# Patient Record
Sex: Female | Born: 1985 | Race: White | Hispanic: No | Marital: Married | Smoking: Never smoker
Health system: Southern US, Community
[De-identification: ages and names within clinical notes are randomized; demographics above are authoritative.]

## PROBLEM LIST (undated history)

## (undated) HISTORY — PX: WISDOM TOOTH EXTRACTION: SHX21

---

## 2007-04-11 ENCOUNTER — Other Ambulatory Visit: Payer: Self-pay

## 2007-04-11 ENCOUNTER — Emergency Department: Payer: Self-pay | Admitting: Emergency Medicine

## 2007-05-27 ENCOUNTER — Observation Stay: Payer: Self-pay

## 2007-07-25 ENCOUNTER — Inpatient Hospital Stay: Payer: Self-pay | Admitting: Obstetrics & Gynecology

## 2010-06-30 ENCOUNTER — Ambulatory Visit: Payer: Self-pay | Admitting: General Practice

## 2011-11-19 IMAGING — CR DG ELBOW COMPLETE 3+V*L*
1 series · 5 of 5 positions shown · non-contrast
Comparison: none

REASON FOR EXAM: pain, injury
COMMENTS:

[Series 1: view not recorded · 0.17mm/px · 5 of 5 slices shown]
[im 1/5]
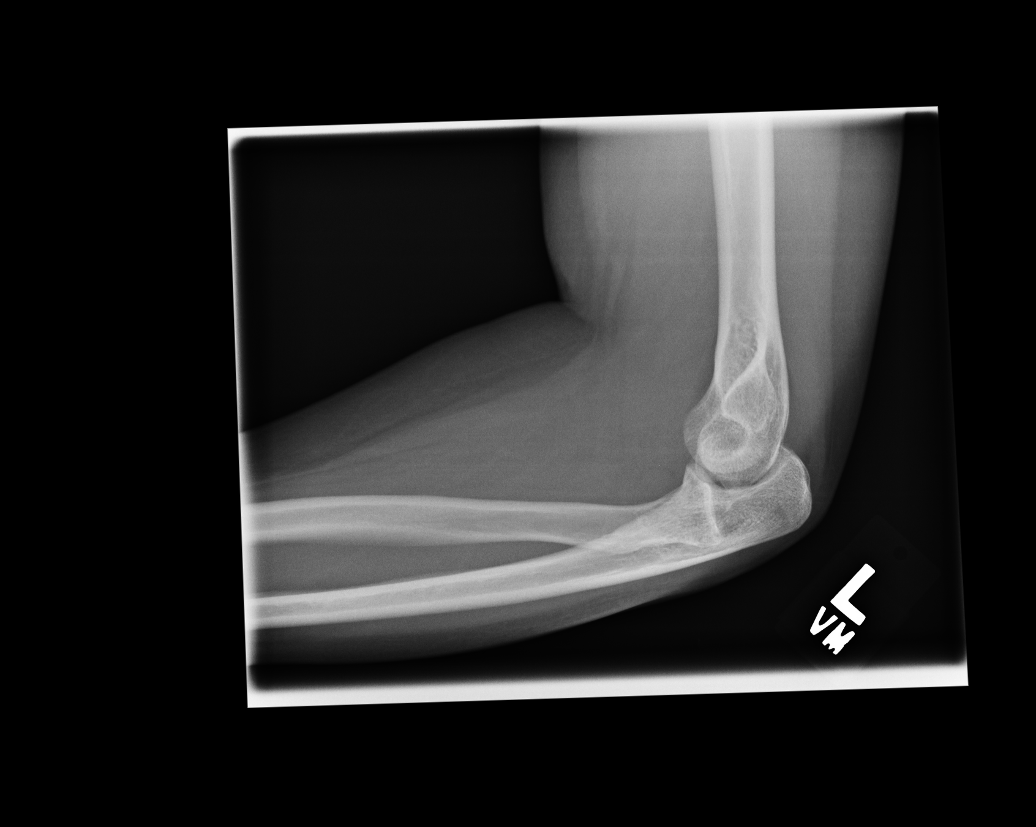
[im 2/5]
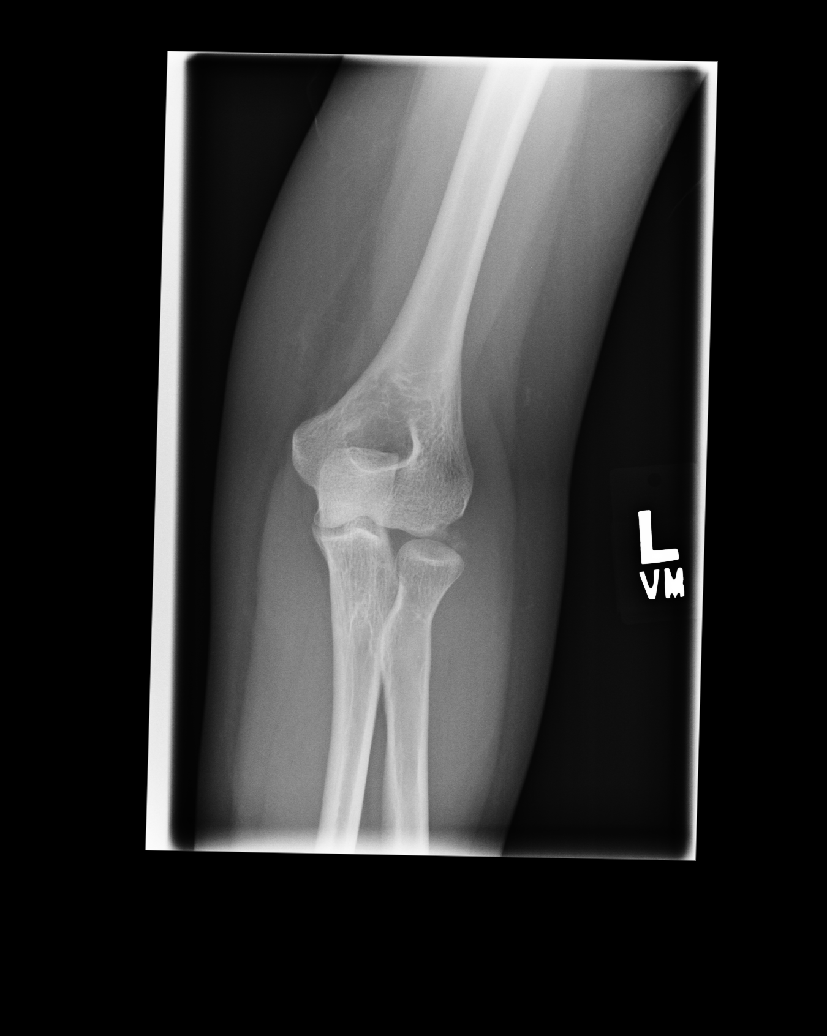
[im 3/5]
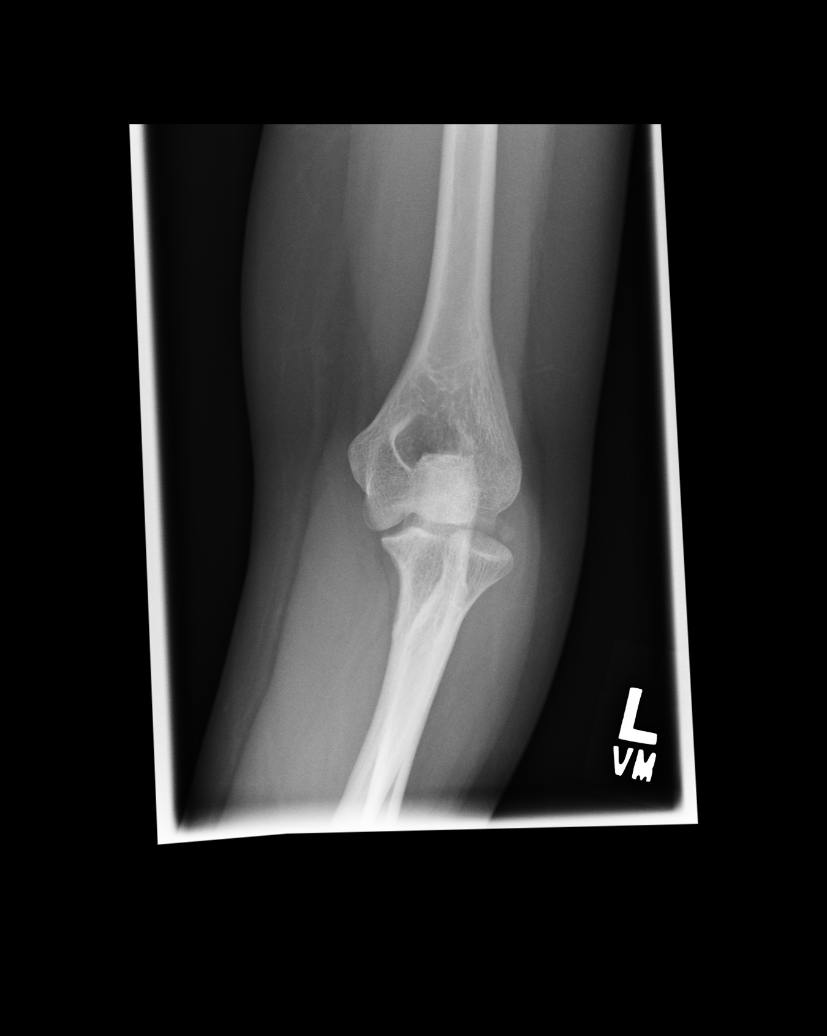
[im 4/5]
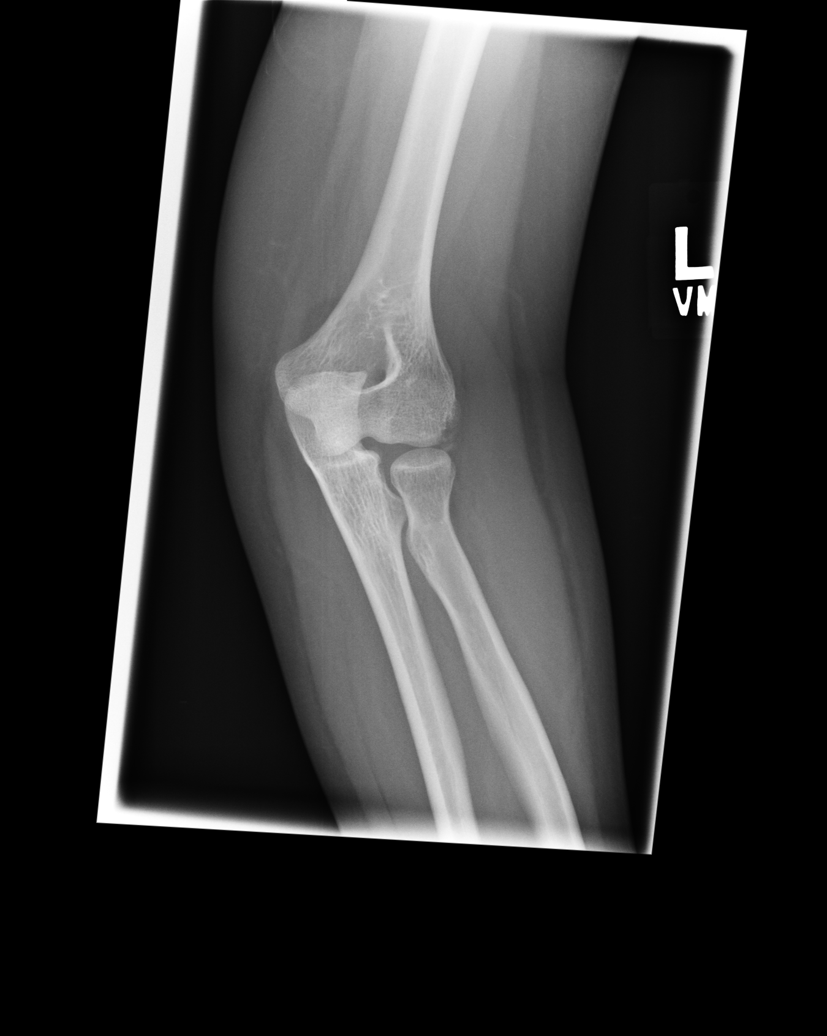
[im 5/5]
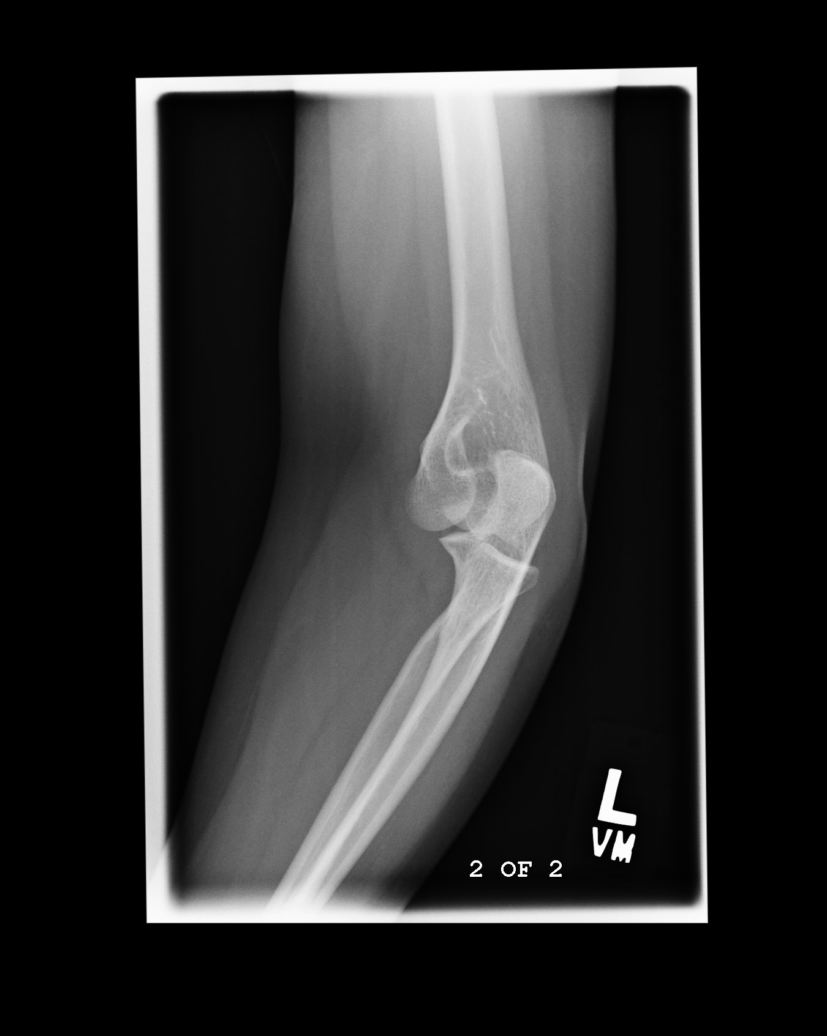

[5 of 5 positions shown; findings below may reference images not displayed]

PROCEDURE:     DXR - DXR ELBOW LT COMP W/OBLIQUES  - June 30, 2010  [DATE]

RESULT:     Images of the left elbow demonstrate that the radial head
appears to be dislocated dorsally on the lateral view. There does not appear
to be evidence of hemarthrosis. There is irregularity about the distal
lateral aspect of the humerus in the lateral capitellar region which appears
chronic. An acute avulsion is felt to be less likely. Has there been a
history of lateral epicondylitis? The radial head appears intact. The ulna
appears intact proximally. Forearm images to evaluate for distal ulnar
fracture may be beneficial.
IMPRESSION: Subluxation dorsally of the radial head with some
irregularity about the lateral capitellar region possibly secondary to
chronic inflammation. Osteochondritis desiccans or lateral epicondylitis
could be considered.

## 2011-11-19 IMAGING — CR DG C-ARM 1-60 MIN
2 series · 3 of 3 positions shown · non-contrast
Comparison: none

REASON FOR EXAM: Fracture Reduction/Fixation of Left Elbow
COMMENTS:

PROCEDURE:     DXR - DXR C-ARM WITH SPOT IMAGES  - June 30, 2010  [DATE]
RESULT:     Digital lateral C-arm images show the radial head is in an
anatomic location compared to the previous exam. A posterior splint appears
present.

[cont.]
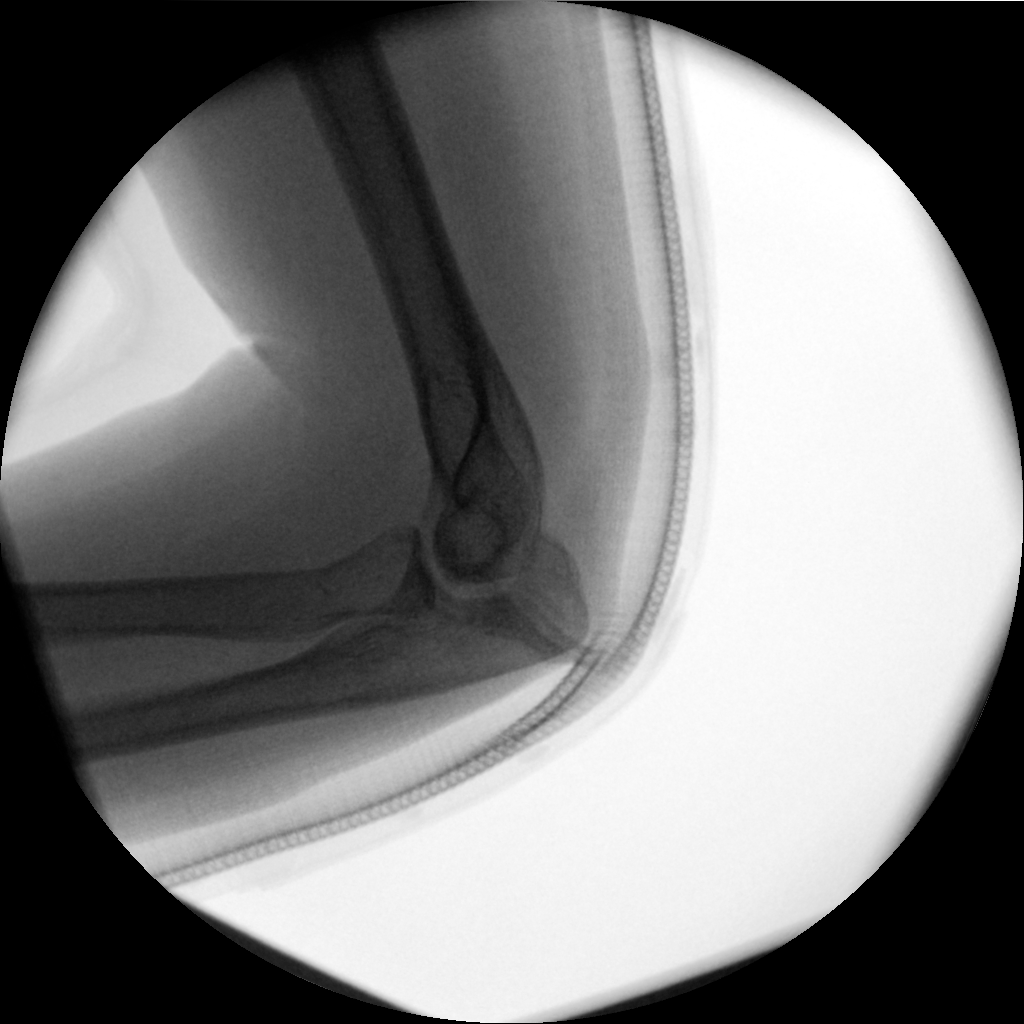

[Series 6002: (id) · 2 of 2 slices shown]
[im 1/2]
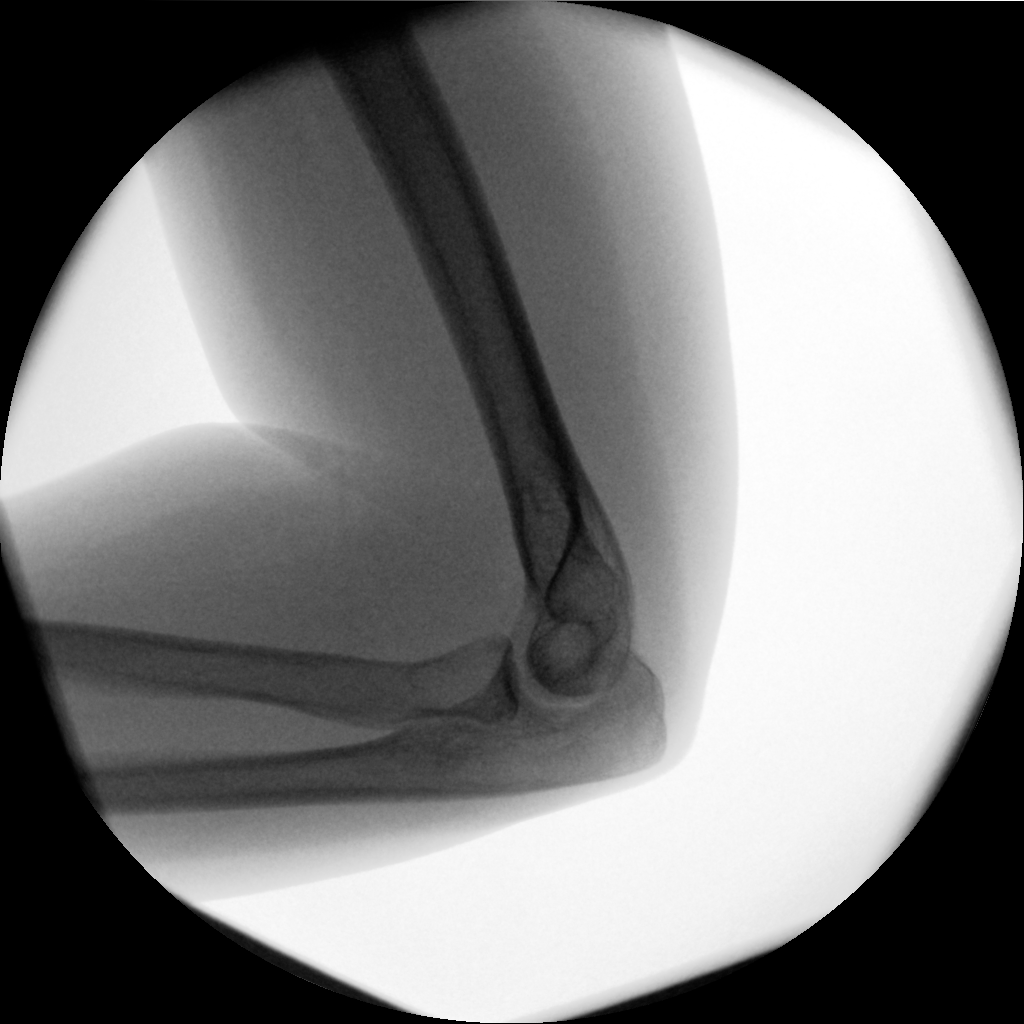
[im 2/2]
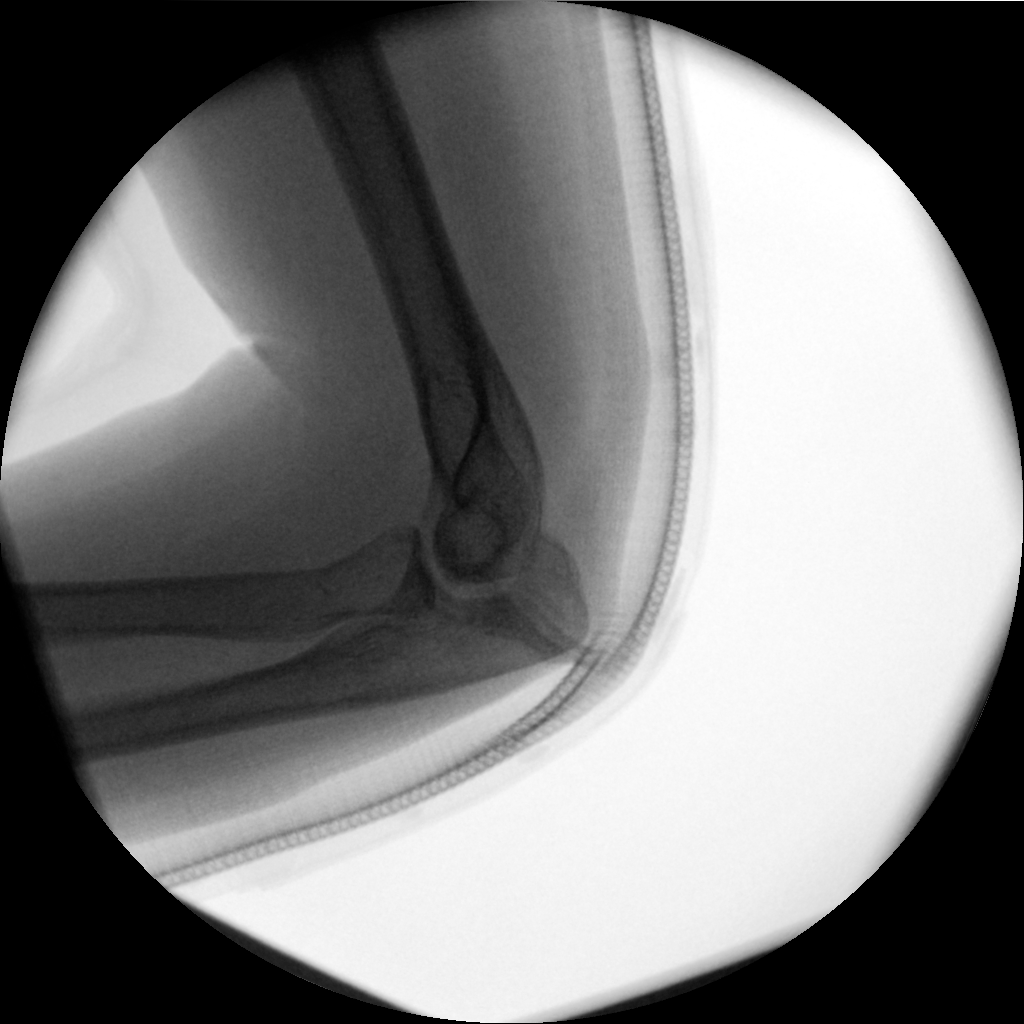

[3 of 3 positions shown; findings below may reference images not displayed]

IMPRESSION: Reduction of the radial head dislocation.

## 2012-09-20 HISTORY — PX: INTRAUTERINE DEVICE (IUD) INSERTION: SHX5877

## 2017-09-16 ENCOUNTER — Telehealth: Payer: Self-pay | Admitting: Advanced Practice Midwife

## 2017-09-16 ENCOUNTER — Encounter: Payer: Self-pay | Admitting: Maternal Newborn

## 2017-09-16 ENCOUNTER — Ambulatory Visit (INDEPENDENT_AMBULATORY_CARE_PROVIDER_SITE_OTHER): Payer: BLUE CROSS/BLUE SHIELD | Admitting: Maternal Newborn

## 2017-09-16 VITALS — BP 120/90 | HR 66 | Ht 61.0 in | Wt 137.0 lb

## 2017-09-16 DIAGNOSIS — Z124 Encounter for screening for malignant neoplasm of cervix: Secondary | ICD-10-CM

## 2017-09-16 DIAGNOSIS — Z01419 Encounter for gynecological examination (general) (routine) without abnormal findings: Secondary | ICD-10-CM | POA: Diagnosis not present

## 2017-09-16 NOTE — Telephone Encounter (Signed)
Patient scheduled 1/21 with Erskine SquibbJane for Mirena replacement

## 2017-09-16 NOTE — Progress Notes (Signed)
Gynecology Annual Exam  PCP: Patient, No Pcp Per  Chief Complaint:  Chief Complaint  Patient presents with  . Gynecologic Exam    iud needs replacing    History of Present Illness: Patient is a 32 y.o. G1P1001 presents for annual exam. The patient has no complaints today.   LMP: No LMP recorded (lmp unknown). Patient is not currently having periods (Reason: IUD). Intermenstrual Bleeding: no Postcoital Bleeding: no Dysmenorrhea: not applicable  The patient is sexually active. She currently uses IUD for contraception. She denies dyspareunia.  The patient does not perform self breast exams.  There is no notable family history of breast or ovarian cancer in her family.  The patient wears seatbelts: yes.   The patient has regular exercise: yes.    The patient denies current symptoms of depression.    Review of Systems  Constitutional: Negative.   HENT: Negative.   Eyes: Negative.   Respiratory: Negative for cough, shortness of breath and wheezing.   Cardiovascular: Negative for chest pain and palpitations.  Gastrointestinal: Negative for abdominal pain, constipation, diarrhea, heartburn and nausea.  Genitourinary: Negative.   Musculoskeletal: Negative.   Skin: Negative.   Neurological: Negative.   Endo/Heme/Allergies: Negative.   Psychiatric/Behavioral: Negative for depression. The patient is not nervous/anxious.   All other systems reviewed and are negative.   Past Medical History:  History reviewed. No pertinent past medical history.  Past Surgical History:  Past Surgical History:  Procedure Laterality Date  . CESAREAN SECTION  2008  . INTRAUTERINE DEVICE (IUD) INSERTION  09/20/2012  . WISDOM TOOTH EXTRACTION  05/2017; 08/2015   two taken    Gynecologic History:  No LMP recorded (lmp unknown). Patient is not currently having periods (Reason: IUD). Contraception: IUD Last Pap: No records of previous Pap.  Obstetric History: G1P1001  Family History:    History reviewed. No pertinent family history.  Social History:  Social History   Socioeconomic History  . Marital status: Married    Spouse name: Not on file  . Number of children: Not on file  . Years of education: Not on file  . Highest education level: Not on file  Social Needs  . Financial resource strain: Not on file  . Food insecurity - worry: Not on file  . Food insecurity - inability: Not on file  . Transportation needs - medical: Not on file  . Transportation needs - non-medical: Not on file  Occupational History  . Not on file  Tobacco Use  . Smoking status: Never Smoker  . Smokeless tobacco: Never Used  Substance and Sexual Activity  . Alcohol use: Yes    Comment: occ  . Drug use: No  . Sexual activity: Yes    Birth control/protection: IUD  Other Topics Concern  . Not on file  Social History Narrative  . Not on file    Allergies:  No Known Allergies  Medications: Prior to Admission medications   Medication Sig Start Date End Date Taking? Authorizing Provider  levonorgestrel (MIRENA) 20 MCG/24HR IUD 1 each by Intrauterine route once.   Yes [provider]    Physical Exam Vitals: Blood pressure 120/90, pulse 66, height 5\' 1"  (1.549 m), weight 137 lb (62.1 kg).  General: NAD HEENT: normocephalic, anicteric Thyroid: no enlargement, no palpable nodules Pulmonary: No increased work of breathing, CTAB Cardiovascular: RRR, S1, S2 heard, no murmurs, rubs or gallops Breast: Breasts symmetrical, no tenderness, no palpable nodules or masses, no skin or nipple retraction present, no  nipple discharge.  No axillary or supraclavicular lymphadenopathy. Abdomen: soft, non-tender, non-distended.  Umbilicus without lesions.  No hepatomegaly, splenomegaly or masses palpable. No evidence of hernia  Genitourinary:  External: Normal external female genitalia.  Normal urethral  meatus, normal Bartholin's and Skene's glands.    Vagina: Normal vaginal mucosa, no  evidence of prolapse.    Cervix: Grossly normal in appearance, no bleeding, IUD strings  visible  Uterus: Non-enlarged, mobile, normal contour.  No CMT  Adnexa: ovaries non-enlarged, no adnexal masses  Rectal: deferred  Lymphatic: no evidence of inguinal lymphadenopathy Extremities: no edema, erythema, or tenderness Neurologic: Grossly intact Psychiatric: mood appropriate, affect full  Assessment: 32 y.o. G1P1001 routine annual exam.  Plan: Problem List Items Addressed This Visit    None    Visit Diagnoses    Encounter for annual routine gynecological examination    -  Primary   Pap smear for cervical cancer screening       Relevant Orders   Pap IG and HPV (high risk) DNA detection      1) STI screening was offered and declined.  2) ASCCP guidelines and rationale discussed.  Patient opts for every 3 year screening interval.  3) Contraception - She is satisfied with her Mirena IUD which will be 34 years old in a few days. She will schedule a removal and reinsertion appointment today.  4) Routine healthcare maintenance including cholesterol, diabetes screening discussed: Declines.  5) Follow up 1 year for routine annual exam.  Marcelyn Bruins, CNM 09/16/2017  10:12 AM

## 2017-09-19 ENCOUNTER — Ambulatory Visit: Payer: BLUE CROSS/BLUE SHIELD | Admitting: Advanced Practice Midwife

## 2017-09-19 ENCOUNTER — Encounter: Payer: Self-pay | Admitting: Advanced Practice Midwife

## 2017-09-19 VITALS — BP 120/70 | HR 72 | Ht 61.0 in | Wt 142.0 lb

## 2017-09-19 DIAGNOSIS — Z30433 Encounter for removal and reinsertion of intrauterine contraceptive device: Secondary | ICD-10-CM

## 2017-09-19 LAB — PAP IG AND HPV HIGH-RISK
HPV, HIGH-RISK: NEGATIVE
PAP Smear Comment: 0

## 2017-09-19 NOTE — Progress Notes (Signed)
    GYNECOLOGY OFFICE PROCEDURE NOTE  Sharon RangeMelissa Diaz is a 32 y.o. G1P1001 here for IUD removal and reinsertion. The patient currently has a Mirena IUD placed 5 years ago, which will be replaced with a Mirena IUD today.  No GYN concerns.  Last pap smear was on 09/16/2017 and cytology results are pending, HPV is negative.  IUD Removal and Reinsertion  Patient identified, informed consent performed, consent signed.   Discussed risks of irregular bleeding, cramping, infection, malpositioning or uterine perforation of the IUD which may require further procedures. Time out was performed. Speculum placed in the vagina. The strings of the IUD were grasped and pulled using curved forceps. The IUD was successfully removed in its entirety. The cervix was cleaned with Betadine x 2 and grasped posteriorly with a single tooth tenaculum. IUD insertion apparatus was used to sound the uterus to 6 cm using a uterine sound.  The IUD was then placed per manufacturer's recommendations. Strings trimmed to 2 cm. Tenaculum was removed, good hemostasis noted. Patient tolerated procedure well.   Patient was given post-procedure instructions.  Patient was also asked to check IUD strings periodically and follow up in 6 weeks for IUD check.   Tresea MallJane Omarius Grantham, CNM  [facility], [facility]  IUD insertion CPT [id number],  Christean GriefSkyla [id number] Mirena [phone] Penni BombardLiletta [phone] Paraguard [id number] IUD remval [phone] Modifer 25, plus Modifer 79 is done during a global billing visit

## 2017-09-19 NOTE — Patient Instructions (Signed)

## 2017-10-17 ENCOUNTER — Encounter: Payer: Self-pay | Admitting: Advanced Practice Midwife

## 2017-10-17 ENCOUNTER — Ambulatory Visit: Payer: BLUE CROSS/BLUE SHIELD | Admitting: Advanced Practice Midwife

## 2017-10-17 VITALS — BP 112/72 | Ht 61.0 in | Wt 138.0 lb

## 2017-10-17 DIAGNOSIS — Z30431 Encounter for routine checking of intrauterine contraceptive device: Secondary | ICD-10-CM | POA: Diagnosis not present

## 2017-10-17 NOTE — Progress Notes (Signed)
Obstetrics & Gynecology Office Visit   Chief Complaint:  Chief Complaint  Patient presents with  . Follow-up    IUD string check    History of Present Illness: 32 y.o. patient presenting for follow up of Mirena IUD placement 4 weeks ago. She denies any complications since her IUD placement.  She is not having any bleeding and she has not tried to feel the strings.   Review of Systems: Review of Systems  Constitutional: Negative.   HENT: Negative.   Eyes: Negative.   Respiratory: Negative.   Cardiovascular: Negative.   Gastrointestinal: Negative.   Genitourinary: Negative.   Musculoskeletal: Negative.   Skin: Negative.   Neurological: Negative.   Endo/Heme/Allergies: Negative.   Psychiatric/Behavioral: Negative.     Past Medical History:  No past medical history on file.  Past Surgical History:  Past Surgical History:  Procedure Laterality Date  . CESAREAN SECTION  2008  . INTRAUTERINE DEVICE (IUD) INSERTION  09/20/2012  . WISDOM TOOTH EXTRACTION  05/2017; 08/2015   two taken    Gynecologic History: No LMP recorded. Patient is not currently having periods (Reason: IUD).  Obstetric History: G1P1001  Family History:  No family history on file.  Social History:  Social History   Socioeconomic History  . Marital status: Married    Spouse name: Not on file  . Number of children: Not on file  . Years of education: Not on file  . Highest education level: Not on file  Social Needs  . Financial resource strain: Not on file  . Food insecurity - worry: Not on file  . Food insecurity - inability: Not on file  . Transportation needs - medical: Not on file  . Transportation needs - non-medical: Not on file  Occupational History  . Not on file  Tobacco Use  . Smoking status: Never Smoker  . Smokeless tobacco: Never Used  Substance and Sexual Activity  . Alcohol use: Yes    Comment: occ  . Drug use: No  . Sexual activity: Yes    Birth control/protection:  IUD  Other Topics Concern  . Not on file  Social History Narrative  . Not on file    Allergies:  No Known Allergies  Medications: Prior to Admission medications   Medication Sig Start Date End Date Taking? Authorizing Provider  levonorgestrel (MIRENA) 20 MCG/24HR IUD 1 each by Intrauterine route once.    [provider]    Physical Exam Vitals:  Vitals:   10/17/17 0837  BP: 112/72   No LMP recorded. Patient is not currently having periods (Reason: IUD).  General: NAD HEENT: normocephalic, anicteric Pulmonary: No increased work of breathing Cardiovascular: RRR, distal pulses 2+ Genitourinary:  External: Normal external female genitalia.  Normal urethral meatus, normal  Bartholin's and Skene's glands.    Vagina: Normal vaginal mucosa, no evidence of prolapse.    Cervix: Grossly normal in appearance, no bleeding, IUD strings not visible initially but teased out of cervix with curved forceps enough to know they are there   Uterus: Non-enlarged, mobile, normal contour.  No CMT  Adnexa: ovaries non-enlarged, no adnexal masses  Rectal: deferred  Lymphatic: no evidence of inguinal lymphadenopathy Extremities: no edema, erythema, or tenderness Neurologic: Grossly intact Psychiatric: mood appropriate, affect full   Assessment: 32 y.o. G1P1001 IUD string check  Plan: Problem List Items Addressed This Visit    None       1.  The patient was given instructions to check her IUD  strings monthly and call with any problems or concerns.  She should call for fevers, chills, abnormal vaginal discharge, pelvic pain, or other complaints. Given that strings are short and essentially inside the cervix she is encouraged to come in for u/s visualization for increase in pain.  2.   IUDs while effective at preventing pregnancy do not prevent transmission of sexually transmitted diseases and use of barrier methods for this purpose was discussed.  Low overall incidence of failure with  99.7% efficacy rate in typical use.  The patient has not contraindication to IUD placement.  3.  She will return for a annual exam in 1 year.  All questions answered.  4) A total of 15 minutes were spent in face-to-face contact with the patient during this encounter with over half of that time devoted to counseling and coordination of care.  5) No Follow-up on file.   Tresea MallJane Rubbie Goostree, CNM [facility], [facility] 10/17/2017, 9:01 AM

## 2017-10-25 NOTE — Telephone Encounter (Signed)
Mirena received 09/19/17

## 2018-03-13 ENCOUNTER — Encounter: Payer: Self-pay | Admitting: Emergency Medicine

## 2018-03-13 ENCOUNTER — Other Ambulatory Visit: Payer: Self-pay

## 2018-03-13 ENCOUNTER — Ambulatory Visit
Admission: EM | Admit: 2018-03-13 | Discharge: 2018-03-13 | Disposition: A | Discharge status: Home / Self Care | Payer: BLUE CROSS/BLUE SHIELD | Attending: Family Medicine | Admitting: Family Medicine

## 2018-03-13 DIAGNOSIS — T63421A Toxic effect of venom of ants, accidental (unintentional), initial encounter: Secondary | ICD-10-CM | POA: Diagnosis not present

## 2018-03-13 DIAGNOSIS — W57XXXA Bitten or stung by nonvenomous insect and other nonvenomous arthropods, initial encounter: Secondary | ICD-10-CM

## 2018-03-13 DIAGNOSIS — S90862A Insect bite (nonvenomous), left foot, initial encounter: Secondary | ICD-10-CM | POA: Diagnosis not present

## 2018-03-13 DIAGNOSIS — S90861A Insect bite (nonvenomous), right foot, initial encounter: Secondary | ICD-10-CM | POA: Diagnosis not present

## 2018-03-13 MED ORDER — SULFAMETHOXAZOLE-TRIMETHOPRIM 800-160 MG PO TABS: 1.0000 | ORAL_TABLET | Freq: Two times a day (BID) | ORAL | 0 refills | Status: DC

## 2018-03-13 NOTE — ED Provider Notes (Signed)
[facility]    CSN: [id number] Arrival date & time: 03/13/18  1232     History   Chief Complaint Chief Complaint  Patient presents with  . Insect Bite    HPI Sharon Diaz is a 32 y.o. female.   32 yo female c/o fire ant bites to feet 3 days ago and now with areas red, tender and warm. Denies fevers or chills.   The history is provided by the patient.    History reviewed. No pertinent past medical history.  There are no active problems to display for this patient.   Past Surgical History:  Procedure Laterality Date  . CESAREAN SECTION  2008  . INTRAUTERINE DEVICE (IUD) INSERTION  09/20/2012  . WISDOM TOOTH EXTRACTION  05/2017; 08/2015   two taken    OB History    Gravida  1   Para  1   Term  1   Preterm      AB      Living  1     SAB      TAB      Ectopic      Multiple      Live Births  1            Home Medications    Prior to Admission medications   Medication Sig Start Date End Date Taking? Authorizing Provider  levonorgestrel (MIRENA) 20 MCG/24HR IUD 1 each by Intrauterine route once.    [provider]  sulfamethoxazole-trimethoprim (BACTRIM DS,SEPTRA DS) 800-160 MG tablet Take 1 tablet by mouth 2 (two) times daily. 03/13/18   Payton Mccallumonty, Venancio Chenier, MD    Family History Family History  Problem Relation Age of Onset  . Healthy Mother   . Healthy Father     Social History Social History   Tobacco Use  . Smoking status: Never Smoker  . Smokeless tobacco: Never Used  Substance Use Topics  . Alcohol use: Yes    Comment: occ  . Drug use: No     Allergies   Patient has no known allergies.   Review of Systems Review of Systems   Physical Exam Triage Vital Signs ED Triage Vitals  Enc Vitals Group     BP 03/13/18 1246 122/84     Pulse Rate 03/13/18 1246 63     Resp 03/13/18 1246 18     Temp 03/13/18 1246 98.1 F (36.7 C)     Temp Source 03/13/18 1246 Oral     SpO2 03/13/18 1246 99 %     Weight  03/13/18 1248 133 lb (60.3 kg)     Height 03/13/18 1248 5\' 1"  (1.549 m)     Head Circumference --      Peak Flow --      Pain Score 03/13/18 1247 6     Pain Loc --      Pain Edu? --      Excl. in GC? --    No data found.  Updated Vital Signs BP 122/84 (BP Location: Left Arm)   Pulse 63   Temp 98.1 F (36.7 C) (Oral)   Resp 18   Ht 5\' 1"  (1.549 m)   Wt 133 lb (60.3 kg)   SpO2 99%   BMI 25.13 kg/m   Visual Acuity Right Eye Distance:   Left Eye Distance:   Bilateral Distance:    Right Eye Near:   Left Eye Near:    Bilateral Near:     Physical Exam  Constitutional: She appears  well-developed and well-nourished. No distress.  Skin: She is not diaphoretic. There is erythema (blanchable, warmth, tender to feet).  Nursing note and vitals reviewed.    UC Treatments / Results  Labs (all labs ordered are listed, but only abnormal results are displayed) Labs Reviewed - No data to display  EKG None  Radiology No results found.  Procedures Procedures (including critical care time)  Medications Ordered in UC Medications - No data to display  Initial Impression / Assessment and Plan / UC Course  I have reviewed the triage vital signs and the nursing notes.  Pertinent labs & imaging results that were available during my care of the patient were reviewed by me and considered in my medical decision making (see chart for details).      Final Clinical Impressions(s) / UC Diagnoses   Final diagnoses:  Bug bite with infection, initial encounter  Fire ant bite, accidental or unintentional, initial encounter     Discharge Instructions     Over the counter antihistamines and cortisone ointment   ED Prescriptions    Medication Sig Dispense Auth. Provider   sulfamethoxazole-trimethoprim (BACTRIM DS,SEPTRA DS) 800-160 MG tablet Take 1 tablet by mouth 2 (two) times daily. 14 tablet Payton Mccallum, MD     1.diagnosis reviewed with patient 2. rx as per orders above;  reviewed possible side effects, interactions, risks and benefits  3. Recommend supportive treatment with elevated, warm compresses 4. Follow-up prn if symptoms worsen or don't improve  Controlled Substance Prescriptions [place] Controlled Substance Registry consulted? Not Applicable   Payton Mccallum, MD 03/13/18 [phone]

## 2018-03-13 NOTE — Discharge Instructions (Signed)
Over the counter antihistamines and cortisone ointment

## 2018-03-13 NOTE — ED Triage Notes (Signed)
Patient stepped on fire ants on Saturday and has bites to there left and right foot. Patient states Benadryl and Cortisone cream are not helping with the itching and pain.

## 2018-05-21 ENCOUNTER — Ambulatory Visit
Admission: EM | Admit: 2018-05-21 | Discharge: 2018-05-21 | Disposition: A | Discharge status: Home / Self Care | Payer: BLUE CROSS/BLUE SHIELD | Attending: Family Medicine | Admitting: Family Medicine

## 2018-05-21 ENCOUNTER — Encounter: Payer: Self-pay | Admitting: Emergency Medicine

## 2018-05-21 ENCOUNTER — Other Ambulatory Visit: Payer: Self-pay

## 2018-05-21 DIAGNOSIS — S90861A Insect bite (nonvenomous), right foot, initial encounter: Secondary | ICD-10-CM | POA: Diagnosis not present

## 2018-05-21 DIAGNOSIS — W57XXXA Bitten or stung by nonvenomous insect and other nonvenomous arthropods, initial encounter: Secondary | ICD-10-CM

## 2018-05-21 MED ORDER — DEXAMETHASONE SODIUM PHOSPHATE 10 MG/ML IJ SOLN: 10.0000 mg | Freq: Once | INTRAMUSCULAR | Status: DC

## 2018-05-21 MED ORDER — SULFAMETHOXAZOLE-TRIMETHOPRIM 800-160 MG PO TABS: 1.0000 | ORAL_TABLET | Freq: Two times a day (BID) | ORAL | 0 refills | Status: AC

## 2018-05-21 MED ORDER — TRIAMCINOLONE ACETONIDE 0.1 % EX CREA: 1.0000 "application " | TOPICAL_CREAM | Freq: Two times a day (BID) | CUTANEOUS | 0 refills | Status: AC

## 2018-05-21 MED ORDER — DEXAMETHASONE SODIUM PHOSPHATE 10 MG/ML IJ SOLN
10.0000 mg | Freq: Once | INTRAMUSCULAR | Status: AC
  Administered 2018-05-21: 10 mg via INTRAMUSCULAR

## 2018-05-21 MED ORDER — MUPIROCIN 2 % EX OINT: TOPICAL_OINTMENT | CUTANEOUS | 0 refills | Status: AC

## 2018-05-21 NOTE — ED Triage Notes (Signed)
Patient states that she got bit by ants to her right foot that started last night.  Patient c/o redness, swelling and pain at the sites.

## 2018-05-21 NOTE — ED Provider Notes (Signed)
[facility] ____________________________________________  Time seen: Approximately 2:38 PM  I have reviewed the triage vital signs and the nursing notes.   HISTORY  Chief Complaint Insect Bite (fire ants)   HPI Sharon Diaz is a 32 y.o. female presenting for evaluation of right foot bites that happened last night.  States she was at a baseball game standing in the grass and then noticed she had an aunt on her that had bitten her.  Reports she had similar situation happened in the past and subsequently developed an infection and needed an antibiotic.  States here today because she did not want the area to get worse and infected.  States the area is red, tight and swollen but not painful.  Denies any subsequent foot or ankle trauma or rolling the area.  Denies any shortness of breath, lip swelling, tongue swelling or difficulty swallowing.  States feels fine otherwise.  Has keep the area clean.  No other alleviating measures attempted.  Denies aggravating factors.  Reports otherwise feels fine.  Reports tetanus immunization is up-to-date within the last 10 years.  Denies recent sickness. Denies recent antibiotic use.  Denies current pregnancy.   History reviewed. No pertinent past medical history.  There are no active problems to display for this patient.   Past Surgical History:  Procedure Laterality Date  . CESAREAN SECTION  2008  . INTRAUTERINE DEVICE (IUD) INSERTION  09/20/2012  . WISDOM TOOTH EXTRACTION  05/2017; 08/2015   two taken     No current facility-administered medications for this encounter.   Current Outpatient Medications:  .  levonorgestrel (MIRENA) 20 MCG/24HR IUD, 1 each by Intrauterine route once., Disp: , Rfl:  .  mupirocin ointment (BACTROBAN) 2 %, Apply two times a day for 7 days., Disp: 22 g, Rfl: 0 .  sulfamethoxazole-trimethoprim (BACTRIM DS,SEPTRA DS) 800-160 MG tablet, Take 1 tablet by mouth 2 (two) times daily for 10 days., Disp: 20  tablet, Rfl: 0 .  triamcinolone cream (KENALOG) 0.1 %, Apply 1 application topically 2 (two) times daily., Disp: 30 g, Rfl: 0  Allergies Patient has no known allergies.  Family History  Problem Relation Age of Onset  . Healthy Mother   . Healthy Father     Social History Social History   Tobacco Use  . Smoking status: Never Smoker  . Smokeless tobacco: Never Used  Substance Use Topics  . Alcohol use: Yes    Comment: occ  . Drug use: No    Review of Systems Constitutional: No fever Cardiovascular: Denies chest pain. Respiratory: Denies shortness of breath. Skin: as above. ____________________________________________   PHYSICAL EXAM:  VITAL SIGNS: ED Triage Vitals  Enc Vitals Group     BP 05/21/18 1354 118/80     Pulse Rate 05/21/18 1354 69     Resp 05/21/18 1354 14     Temp 05/21/18 1354 98.5 F (36.9 C)     Temp Source 05/21/18 1354 Oral     SpO2 05/21/18 1354 99 %     Weight 05/21/18 1352 134 lb (60.8 kg)     Height 05/21/18 1352 5\' 1"  (1.549 m)     Head Circumference --      Peak Flow --      Pain Score 05/21/18 1351 2     Pain Loc --      Pain Edu? --      Excl. in GC? --     Constitutional: Alert and oriented. Well appearing and in no acute distress. ENT  Head: Normocephalic and atraumatic. Cardiovascular: Normal rate, regular rhythm. Grossly normal heart sounds.  Good peripheral circulation. Respiratory: Normal respiratory effort without tachypnea nor retractions. Breath sounds are clear and equal bilaterally. No wheezes, rales, rhonchi. Musculoskeletal: Bilateral pedal pulses equal and easily palpated. Neurologic:  Normal speech and language.  Speech is normal. No gait instability.  Skin:  Skin is warm, dry. Except: Right distal mid to lateral dorsal foot localized area of erythema with mild edema and approximately 3 papules present, one pustule to dorsal proximal fifth toe with mild clear fluid drainage, nontender, no bony tenderness, normal  distal sensation and capillary refill, right foot with full range of motion present, right lower chimney otherwise without edema. Psychiatric: Mood and affect are normal. Speech and behavior are normal. Patient exhibits appropriate insight and judgment   ___________________________________________   LABS (all labs ordered are listed, but only abnormal results are displayed)  Labs Reviewed - No data to display ____________________________________   PROCEDURES Procedures    INITIAL IMPRESSION / ASSESSMENT AND PLAN / ED COURSE  Pertinent labs & imaging results that were available during my care of the patient were reviewed by me and considered in my medical decision making (see chart for details).  Right dorsal foot insect bite with local reaction.  Discussed with patient, local reaction, 10 mg IM Decadron given once in urgent care.  Will treat with topical triamcinolone and topical Bactroban.  Discussed keeping clean, elevation.  Monitor closely.  Discussed if no improvement in 2 days start Rx hardcopy of Bactrim given.Discussed indication, risks and benefits of medications with patient.  Discussed follow up with Primary care physician this week. Discussed follow up and return parameters including no resolution or any worsening concerns. Patient verbalized understanding and agreed to plan.   ____________________________________________   FINAL CLINICAL IMPRESSION(S) / ED DIAGNOSES  Final diagnoses:  Insect bite of right foot, initial encounter     ED Discharge Orders         Ordered    triamcinolone cream (KENALOG) 0.1 %  2 times daily     05/21/18 1441    mupirocin ointment (BACTROBAN) 2 %     05/21/18 1441    sulfamethoxazole-trimethoprim (BACTRIM DS,SEPTRA DS) 800-160 MG tablet  2 times daily     05/21/18 1442           Note: This dictation was prepared with Dragon dictation along with smaller phrase technology. Any transcriptional errors that result from this  process are unintentional.         Renford DillsMiller, Dianely Krehbiel, NP 05/21/18 1506

## 2018-05-21 NOTE — Discharge Instructions (Addendum)
Take medication as prescribed. Rest. Drink plenty of fluids. Keep clean. Elevate. Monitor closely, and take medication as discussed.   Follow up with your primary care physician this week as needed. Return to Urgent care for new or worsening concerns.
